# Patient Record
Sex: Female | Born: 1960
Health system: Midwestern US, Community
[De-identification: ages and names within clinical notes are randomized; demographics above are authoritative.]

---

## 2005-07-03 ENCOUNTER — Encounter: Admission: RE | Admit: 2005-07-03 | Discharge: 2005-07-03 | Payer: Self-pay | Admitting: Obstetrics and Gynecology

## 2006-07-18 ENCOUNTER — Encounter: Admission: RE | Admit: 2006-07-18 | Discharge: 2006-07-18 | Payer: Self-pay | Admitting: Obstetrics and Gynecology

## 2007-09-25 ENCOUNTER — Encounter: Admission: RE | Admit: 2007-09-25 | Discharge: 2007-09-25 | Payer: Self-pay | Admitting: Unknown Physician Specialty

## 2007-10-03 ENCOUNTER — Encounter: Admission: RE | Admit: 2007-10-03 | Discharge: 2007-10-03 | Payer: Self-pay | Admitting: Unknown Physician Specialty

## 2008-10-07 ENCOUNTER — Encounter: Admission: RE | Admit: 2008-10-07 | Discharge: 2008-10-07 | Payer: Self-pay | Admitting: Unknown Physician Specialty

## 2010-08-01 ENCOUNTER — Other Ambulatory Visit: Payer: Self-pay | Admitting: *Deleted

## 2010-08-01 DIAGNOSIS — Z1231 Encounter for screening mammogram for malignant neoplasm of breast: Secondary | ICD-10-CM

## 2010-08-03 ENCOUNTER — Ambulatory Visit
Admission: RE | Admit: 2010-08-03 | Discharge: 2010-08-03 | Disposition: A | Discharge status: Home / Self Care | Payer: BC Managed Care – PPO | Source: Ambulatory Visit | Attending: *Deleted | Admitting: *Deleted

## 2010-08-03 DIAGNOSIS — Z1231 Encounter for screening mammogram for malignant neoplasm of breast: Secondary | ICD-10-CM

## 2010-08-09 ENCOUNTER — Other Ambulatory Visit: Payer: Self-pay | Admitting: Obstetrics and Gynecology

## 2010-08-09 DIAGNOSIS — Z1231 Encounter for screening mammogram for malignant neoplasm of breast: Secondary | ICD-10-CM

## 2011-10-16 ENCOUNTER — Other Ambulatory Visit: Payer: Self-pay | Admitting: Obstetrics and Gynecology

## 2011-10-16 DIAGNOSIS — Z1231 Encounter for screening mammogram for malignant neoplasm of breast: Secondary | ICD-10-CM

## 2011-10-30 ENCOUNTER — Ambulatory Visit
Admission: RE | Admit: 2011-10-30 | Discharge: 2011-10-30 | Disposition: A | Discharge status: Home / Self Care | Payer: BC Managed Care – PPO | Source: Ambulatory Visit | Attending: Obstetrics and Gynecology | Admitting: Obstetrics and Gynecology

## 2011-10-30 DIAGNOSIS — Z1231 Encounter for screening mammogram for malignant neoplasm of breast: Secondary | ICD-10-CM

## 2012-03-05 ENCOUNTER — Other Ambulatory Visit: Payer: Self-pay | Admitting: Obstetrics and Gynecology

## 2012-03-05 DIAGNOSIS — Z803 Family history of malignant neoplasm of breast: Secondary | ICD-10-CM

## 2012-03-05 DIAGNOSIS — Z1239 Encounter for other screening for malignant neoplasm of breast: Secondary | ICD-10-CM

## 2012-03-14 ENCOUNTER — Ambulatory Visit
Admission: RE | Admit: 2012-03-14 | Discharge: 2012-03-14 | Disposition: A | Discharge status: Home / Self Care | Payer: BC Managed Care – PPO | Source: Ambulatory Visit | Attending: Obstetrics and Gynecology | Admitting: Obstetrics and Gynecology

## 2012-03-14 DIAGNOSIS — Z1239 Encounter for other screening for malignant neoplasm of breast: Secondary | ICD-10-CM

## 2012-03-14 DIAGNOSIS — Z803 Family history of malignant neoplasm of breast: Secondary | ICD-10-CM

## 2012-03-15 ENCOUNTER — Ambulatory Visit
Admission: RE | Admit: 2012-03-15 | Discharge: 2012-03-15 | Disposition: A | Discharge status: Home / Self Care | Payer: BC Managed Care – PPO | Source: Ambulatory Visit | Attending: Obstetrics and Gynecology | Admitting: Obstetrics and Gynecology

## 2012-03-15 DIAGNOSIS — Z803 Family history of malignant neoplasm of breast: Secondary | ICD-10-CM

## 2012-03-15 DIAGNOSIS — Z1239 Encounter for other screening for malignant neoplasm of breast: Secondary | ICD-10-CM

## 2012-03-15 MED ORDER — GADOBENATE DIMEGLUMINE 529 MG/ML IV SOLN
15.0000 mL | Freq: Once | INTRAVENOUS | Status: AC | PRN
  Administered 2012-03-15: 15 mL via INTRAVENOUS

## 2012-03-19 ENCOUNTER — Other Ambulatory Visit: Payer: Self-pay | Admitting: Obstetrics and Gynecology

## 2012-03-19 DIAGNOSIS — R928 Other abnormal and inconclusive findings on diagnostic imaging of breast: Secondary | ICD-10-CM

## 2012-03-22 ENCOUNTER — Ambulatory Visit
Admission: RE | Admit: 2012-03-22 | Discharge: 2012-03-22 | Disposition: A | Discharge status: Home / Self Care | Payer: BC Managed Care – PPO | Source: Ambulatory Visit | Attending: Obstetrics and Gynecology | Admitting: Obstetrics and Gynecology

## 2012-03-22 DIAGNOSIS — R928 Other abnormal and inconclusive findings on diagnostic imaging of breast: Secondary | ICD-10-CM

## 2012-03-22 MED ORDER — GADOBENATE DIMEGLUMINE 529 MG/ML IV SOLN
15.0000 mL | Freq: Once | INTRAVENOUS | Status: AC | PRN
  Administered 2012-03-22: 15 mL via INTRAVENOUS

## 2017-10-22 ENCOUNTER — Other Ambulatory Visit: Payer: Self-pay | Admitting: Obstetrics and Gynecology

## 2017-10-22 DIAGNOSIS — Z808 Family history of malignant neoplasm of other organs or systems: Secondary | ICD-10-CM

## 2017-10-25 ENCOUNTER — Other Ambulatory Visit: Payer: Self-pay

## 2017-11-16 ENCOUNTER — Ambulatory Visit
Admission: RE | Admit: 2017-11-16 | Discharge: 2017-11-16 | Disposition: A | Discharge status: Home / Self Care | Payer: BC Managed Care – PPO | Source: Ambulatory Visit | Attending: Obstetrics and Gynecology | Admitting: Obstetrics and Gynecology

## 2017-11-16 DIAGNOSIS — Z808 Family history of malignant neoplasm of other organs or systems: Secondary | ICD-10-CM

## 2018-09-09 ENCOUNTER — Other Ambulatory Visit: Payer: Self-pay

## 2018-09-09 ENCOUNTER — Emergency Department (HOSPITAL_COMMUNITY): Payer: Managed Care, Other (non HMO)

## 2018-09-09 ENCOUNTER — Encounter (HOSPITAL_COMMUNITY): Payer: Self-pay

## 2018-09-09 ENCOUNTER — Emergency Department (HOSPITAL_COMMUNITY)
Admission: EM | Admit: 2018-09-09 | Discharge: 2018-09-10 | Disposition: A | Discharge status: Home / Self Care | Payer: Managed Care, Other (non HMO) | Attending: Emergency Medicine | Admitting: Emergency Medicine

## 2018-09-09 DIAGNOSIS — R202 Paresthesia of skin: Secondary | ICD-10-CM | POA: Diagnosis not present

## 2018-09-09 DIAGNOSIS — H1131 Conjunctival hemorrhage, right eye: Secondary | ICD-10-CM | POA: Diagnosis not present

## 2018-09-09 DIAGNOSIS — Z79899 Other long term (current) drug therapy: Secondary | ICD-10-CM | POA: Insufficient documentation

## 2018-09-09 NOTE — ED Triage Notes (Signed)
Pt states she was getting her hair done and the right side of her face and head became numb, she was very comfortable and didn't think she had any pressure on her neck, her right eye is also blood shot which she noticed this am

## 2018-09-09 NOTE — ED Notes (Signed)
Bed: WA03 Expected date:  Expected time:  Means of arrival:  Comments: 

## 2018-09-09 NOTE — ED Notes (Signed)
Bed: WA01 Expected date:  Expected time:  Means of arrival:  Comments: 

## 2018-09-09 NOTE — ED Provider Notes (Signed)
[facility] DEPT Provider Note   CSN: [id number] Arrival date & time: 09/09/18  2046    History   Chief Complaint Chief Complaint  Patient presents with  . facial numbness    HPI Denise Lambert is a 58 y.o. female.   The history is provided by the patient.  She has no significant past history and noted onset at about 7:30 PM numbness in the right side of her scalp in the right side of her face.  This occurred while she was at a beauty parlor having her hair tended to.  Numbness has subsided, but is still present.  She also noted a sub-conjunctiva hemorrhage in her right eye.  She denies any weakness and denies any difficulty with her vision.  She denies headache or neck pain.  She is concerned because her mother had a stroke at age 67.  History reviewed. No pertinent past medical history.  There are no active problems to display for this patient.   History reviewed. No pertinent surgical history.   OB History   No obstetric history on file.      Home Medications    Prior to Admission medications   Not on File    Family History History reviewed. No pertinent family history.  Social History Social History   Tobacco Use  . Smoking status: Never Smoker  . Smokeless tobacco: Never Used  Substance Use Topics  . Alcohol use: Never    Frequency: Never  . Drug use: Never     Allergies   Ciprofloxacin; Penicillins; and Vioxx [rofecoxib]   Review of Systems Review of Systems  All other systems reviewed and are negative.    Physical Exam Updated Vital Signs BP (!) 128/58 (BP Location: Left Arm)   Pulse (!) 52   Temp 97.8 F (36.6 C) (Oral)   Resp 19   Ht 5\' 4"  (1.626 m)   Wt 70.3 kg   SpO2 96%   BMI 26.61 kg/m   Physical Exam Vitals signs and nursing note reviewed.    58 year old female, resting comfortably and in no acute distress. Vital signs are significant for slow heart rate. Oxygen saturation is 96%, which is  normal. Head is normocephalic and atraumatic. PERRLA, EOMI. Oropharynx is clear.  Subconjunctival hemorrhage present on the right eye medially. Neck is nontender and supple without adenopathy or JVD. Back is nontender and there is no CVA tenderness. Lungs are clear without rales, wheezes, or rhonchi. Chest is nontender. Heart has regular rate and rhythm without murmur. Abdomen is soft, flat, nontender without masses or hepatosplenomegaly and peristalsis is normoactive. Extremities have no cyanosis or edema, full range of motion is present. Skin is warm and dry without rash. Neurologic: Mental status is normal.  Visual fields intact to confrontation.  There is slight decreased sensation on the right side of the face and the first and second divisions of the trigeminal nerve as well as on the scalp.  Remainder of cranial nerves are intact.  There is also slight decreased sensation in the right shoulder and arm compared with the left.  There is no extinction on double simultaneous stimulation.  Sensation in the legs is normal.  Motor strength is 5/5 in all muscle groups.  Station and gait are normal.  Speech is normal.  ED Treatments / Results  Labs (all labs ordered are listed, but only abnormal results are displayed) Labs Reviewed  PROTIME-INR - Abnormal; Notable for the following components:  Result Value   Prothrombin Time 11.3 (*)    All other components within normal limits  APTT - Abnormal; Notable for the following components:   aPTT 21 (*)    All other components within normal limits  CBC - Abnormal; Notable for the following components:   Platelets 91 (*)    All other components within normal limits  COMPREHENSIVE METABOLIC PANEL - Abnormal; Notable for the following components:   Creatinine, Ser 1.03 (*)    All other components within normal limits  URINALYSIS, ROUTINE W REFLEX MICROSCOPIC - Abnormal; Notable for the following components:   Color, Urine STRAW (*)    Hgb  urine dipstick SMALL (*)    Leukocytes,Ua MODERATE (*)    Bacteria, UA RARE (*)    All other components within normal limits  ETHANOL  DIFFERENTIAL  RAPID URINE DRUG SCREEN, HOSP PERFORMED  I-STAT BETA HCG BLOOD, ED ([facility], [facility], [facility] ONLY)    EKG EKG Interpretation  Date/Time:  Tuesday September 10 2018 00:00:47 EDT Ventricular Rate:  55 PR Interval:    QRS Duration: 100 QT Interval:  473 QTC Calculation: 453 R Axis:   76 Text Interpretation:  Sinus rhythm Low voltage, precordial leads Anteroseptal infarct, old No old tracing to compare Confirmed by Dione BoozeGlick, Salam Micucci ([id number]) on 09/10/2018 1:27:27 AM   Radiology Ct Head Wo Contrast  Result Date: 09/10/2018 CLINICAL DATA:  Right-side of head and face numbness status post getting hair done. EXAM: CT HEAD WITHOUT CONTRAST TECHNIQUE: Contiguous axial images were obtained from the base of the skull through the vertex without intravenous contrast. COMPARISON:  None. FINDINGS: Brain: No evidence of acute infarction, hemorrhage, hydrocephalus, extra-axial collection or mass lesion/mass effect. There is some mild chronic microvascular ischemic changes noted, especially on the left. Vascular: No hyperdense vessel or unexpected calcification. Skull: Normal. Negative for fracture or focal lesion. Sinuses/Orbits: No acute finding. Other: None. IMPRESSION: No acute intracranial abnormality detected. Electronically Signed   By: Katherine Mantlehristopher  Green M.D.   On: 09/10/2018 00:31    Procedures Procedures  Medications Ordered in ED Medications - No data to display   Initial Impression / Assessment and Plan / ED Course  I have reviewed the triage vital signs and the nursing notes.  Pertinent labs & imaging results that were available during my care of the patient were reviewed by me and considered in my medical decision making (see chart for details).  Right-sided numbness worrisome for small stroke.  She is outside of the time window for thrombolytic therapy, and NIH  stroke scale is too low to be considered for interventional radiology approach.  Therefore, code stroke is not activated.  Stroke work-up is initiated.  Old records are reviewed, and she has no relevant past visits.  Some conjunctival hemorrhage on the right is apparently unrelated.  The work-up is unremarkable.  CT of head is normal and labs are all normal.  [facility] to get MRI to rule out stroke.  I have discussed case with Dr. Manus Gunningancour who agrees to accept the patient  Final Clinical Impressions(s) / ED Diagnoses   Final diagnoses:  Paresthesia  Subconjunctival hemorrhage, right    ED Discharge Orders    None       Dione BoozeGlick, Askia Hazelip, MD 09/10/18 0127

## 2018-09-10 ENCOUNTER — Emergency Department (HOSPITAL_COMMUNITY): Payer: Managed Care, Other (non HMO)

## 2018-09-10 LAB — I-STAT BETA HCG BLOOD, ED (MC, WL, AP ONLY): I-stat hCG, quantitative: <5 m[IU]/mL (ref <5)

## 2018-09-10 LAB — DIFFERENTIAL
Abs Immature Granulocytes: 0.01 10*3/uL (ref 0.00–0.07)
Basophils Absolute: 0.1 10*3/uL (ref 0.0–0.1)
Basophils Relative: 1 %
Eosinophils Absolute: 0.1 10*3/uL (ref 0.0–0.5)
Eosinophils Relative: 2 %
Immature Granulocytes: 0 %
Lymphocytes Relative: 43 %
Lymphs Abs: 2.4 10*3/uL (ref 0.7–4.0)
Monocytes Absolute: 0.5 10*3/uL (ref 0.1–1.0)
Monocytes Relative: 9 %
Neutro Abs: 2.5 10*3/uL (ref 1.7–7.7)
Neutrophils Relative %: 45 %

## 2018-09-10 LAB — COMPREHENSIVE METABOLIC PANEL
ALT: 38 U/L (ref 0–44)
AST: 38 U/L (ref 15–41)
Albumin: 4.7 g/dL (ref 3.5–5.0)
Alkaline Phosphatase: 81 U/L (ref 38–126)
Anion gap: 8 (ref 5–15)
BUN: 20 mg/dL (ref 6–20)
CO2: 27 mmol/L (ref 22–32)
Calcium: 9.3 mg/dL (ref 8.9–10.3)
Chloride: 104 mmol/L (ref 98–111)
Creatinine, Ser: 1.03 mg/dL — ABNORMAL HIGH (ref 0.44–1.00)
GFR calc Af Amer: >60 mL/min (ref >60)
GFR calc non Af Amer: >60 mL/min (ref >60)
Glucose, Bld: 89 mg/dL (ref 70–99)
Potassium: 3.8 mmol/L (ref 3.5–5.1)
Sodium: 139 mmol/L (ref 135–145)
Total Bilirubin: 0.6 mg/dL (ref 0.3–1.2)
Total Protein: 7.5 g/dL (ref 6.5–8.1)

## 2018-09-10 LAB — URINALYSIS, ROUTINE W REFLEX MICROSCOPIC
Bilirubin Urine: NEGATIVE
Glucose, UA: NEGATIVE mg/dL
Ketones, ur: NEGATIVE mg/dL
Nitrite: NEGATIVE
Protein, ur: NEGATIVE mg/dL
Specific Gravity, Urine: 1.006 (ref 1.005–1.030)
pH: 6 (ref 5.0–8.0)

## 2018-09-10 LAB — RAPID URINE DRUG SCREEN, HOSP PERFORMED
Amphetamines: NOT DETECTED
Barbiturates: NOT DETECTED
Benzodiazepines: NOT DETECTED
Cocaine: NOT DETECTED
Opiates: NOT DETECTED
Tetrahydrocannabinol: NOT DETECTED

## 2018-09-10 LAB — CBC
HCT: 43.3 % (ref 36.0–46.0)
Hemoglobin: 14.3 g/dL (ref 12.0–15.0)
MCH: 29.5 pg (ref 26.0–34.0)
MCHC: 33 g/dL (ref 30.0–36.0)
MCV: 89.3 fL (ref 80.0–100.0)
Platelets: 91 10*3/uL — ABNORMAL LOW (ref 150–400)
RBC: 4.85 MIL/uL (ref 3.87–5.11)
RDW: 12.5 % (ref 11.5–15.5)
WBC: 5.5 10*3/uL (ref 4.0–10.5)
nRBC: 0 % (ref 0.0–0.2)

## 2018-09-10 LAB — PROTIME-INR
INR: 0.8 (ref 0.8–1.2)
Prothrombin Time: 11.3 seconds — ABNORMAL LOW (ref 11.4–15.2)

## 2018-09-10 LAB — APTT: aPTT: 21 seconds — ABNORMAL LOW (ref 24–36)

## 2018-09-10 LAB — ETHANOL: Alcohol, Ethyl (B): <10 mg/dL (ref <10)

## 2018-09-10 NOTE — ED Notes (Signed)
Patient transported to MRI from lobby

## 2018-09-10 NOTE — ED Notes (Signed)
MRI has one on the exam table then patient will be next.

## 2018-09-10 NOTE — ED Provider Notes (Signed)
Patient was sent from [facility] long for an MRI of her brain.  She has been having numbness throughout her face. No facial droop.  Speech is fluent.  No arm drift.  MRI negative.  She does have right subconjunctival hemorrhage of unclear etiology.  No rash on her face to suggest early zoster. Patient is overall well-appearing.  She feels comfortable discharge home.  She will follow-up with her PCP this week for further evaluation.   Ripley Fraise, MD 09/10/18 [phone]

## 2018-09-10 NOTE — ED Notes (Signed)
Discharge instructions and follow up care discussed with pt. Pt verbalized understanding and has no questions at this time. Teach back method used

## 2018-09-10 NOTE — ED Notes (Signed)
Patient transported to CT 

## 2018-11-13 ENCOUNTER — Other Ambulatory Visit: Payer: Self-pay | Admitting: Obstetrics and Gynecology

## 2018-11-13 DIAGNOSIS — E041 Nontoxic single thyroid nodule: Secondary | ICD-10-CM

## 2018-11-18 ENCOUNTER — Other Ambulatory Visit: Payer: 59

## 2018-11-21 ENCOUNTER — Ambulatory Visit
Admission: RE | Admit: 2018-11-21 | Discharge: 2018-11-21 | Disposition: A | Discharge status: Home / Self Care | Payer: Managed Care, Other (non HMO) | Source: Ambulatory Visit | Attending: Obstetrics and Gynecology | Admitting: Obstetrics and Gynecology

## 2018-11-21 DIAGNOSIS — E041 Nontoxic single thyroid nodule: Secondary | ICD-10-CM

## 2020-07-16 LAB — HEMOGLOBIN A1C: Hemoglobin A1C, External: 5.4 % (ref 4.3–5.6)

## 2020-12-19 IMAGING — US US THYROID
1 series · 13 of 25 positions shown · non-contrast
Comparison: 11/16/2017

CLINICAL DATA: Nodule.

EXAM:
THYROID ULTRASOUND
TECHNIQUE: Ultrasound examination of the thyroid gland and adjacent soft
tissues was performed.

[Series 1: us thyroid · 0.05mm/px · 13 of 47 slices shown]
[im 1/47]
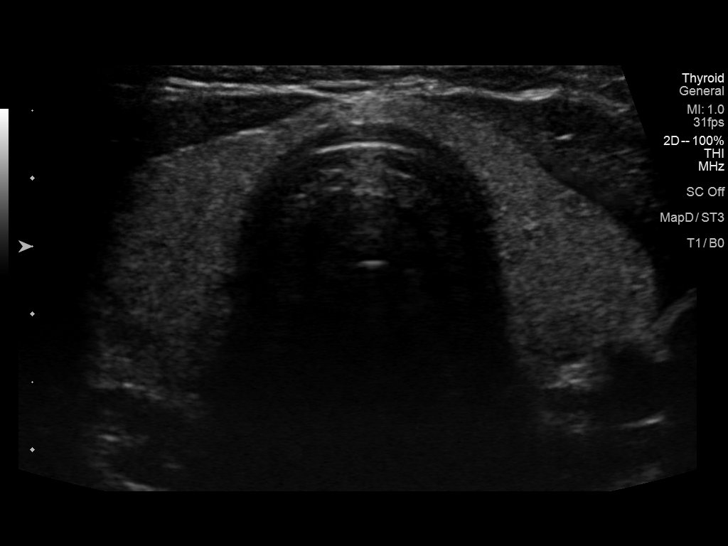
[im 4/47]
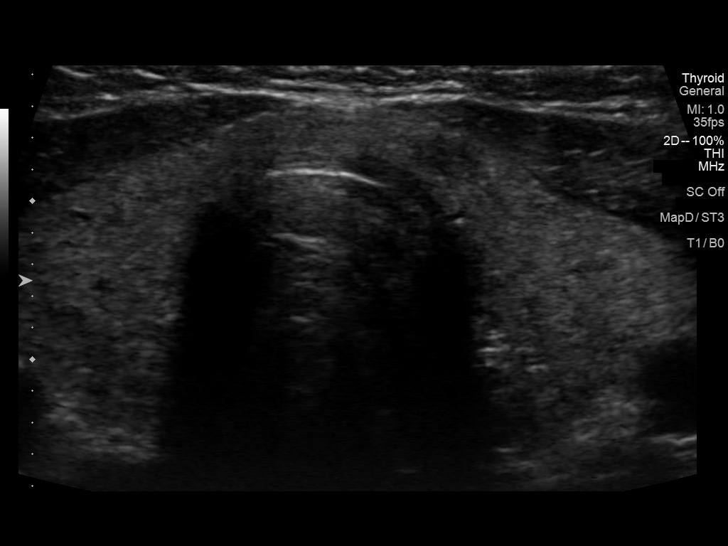
[im 8/47]
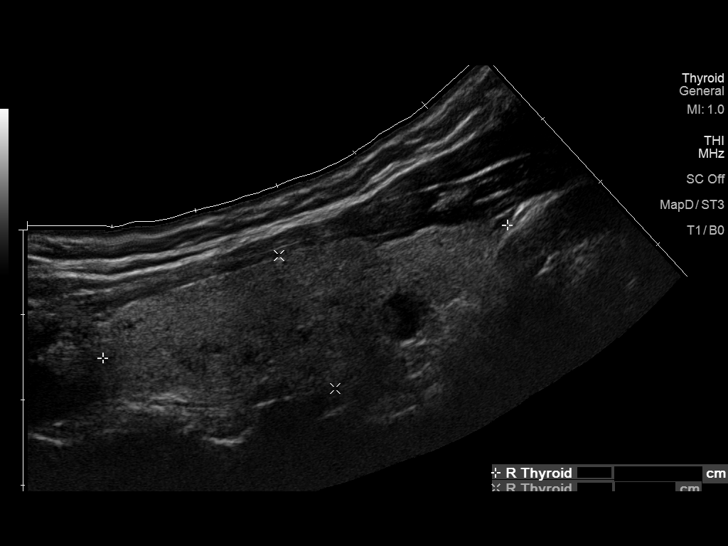
[im 12/47]
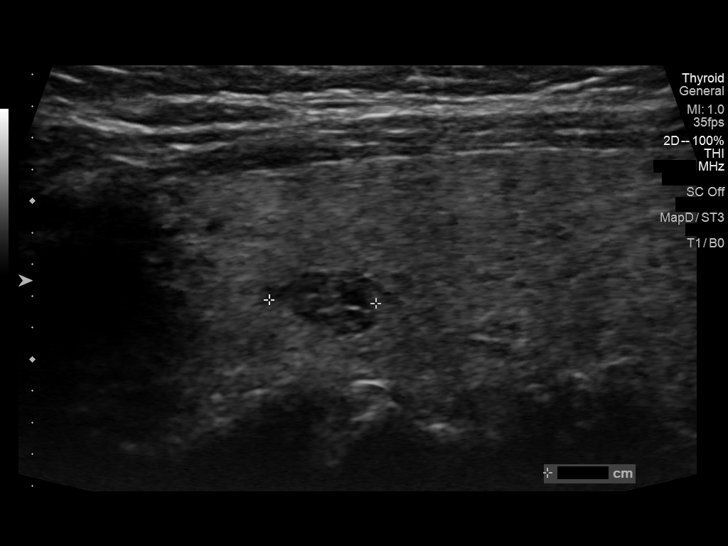
[im 16/47]
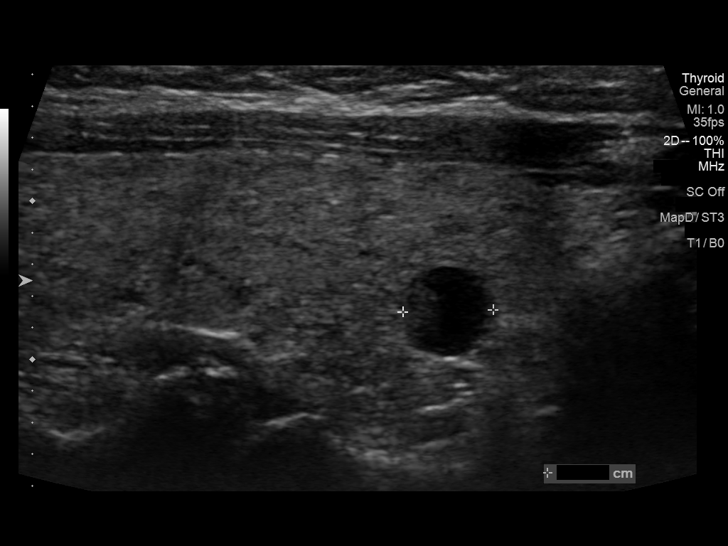
[im 20/47]
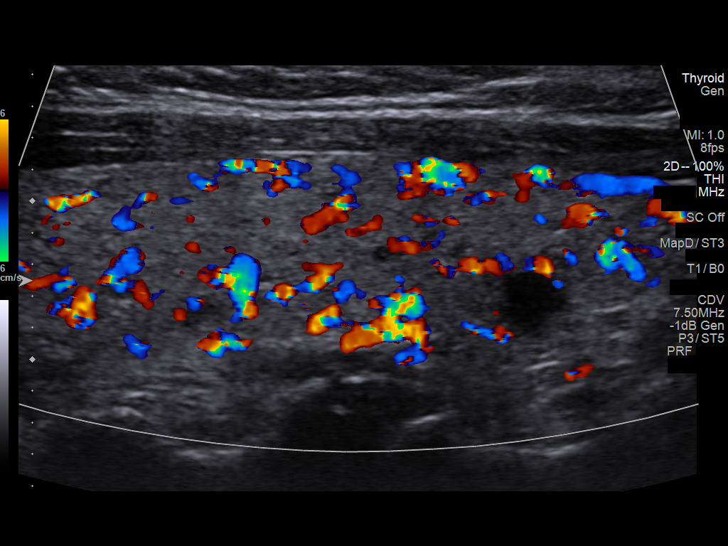
[im 24/47]
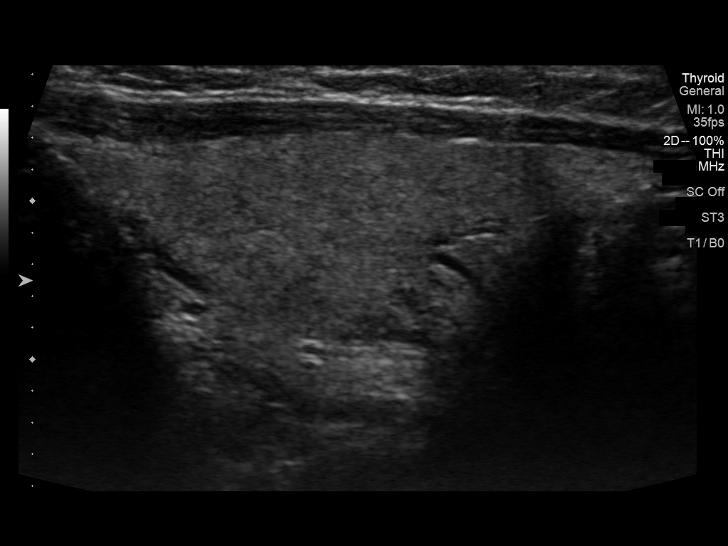
[im 27/47]
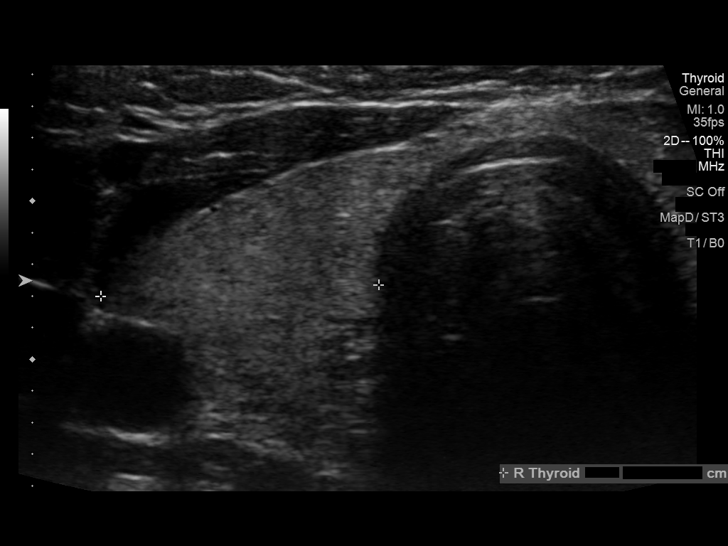
[im 31/47]
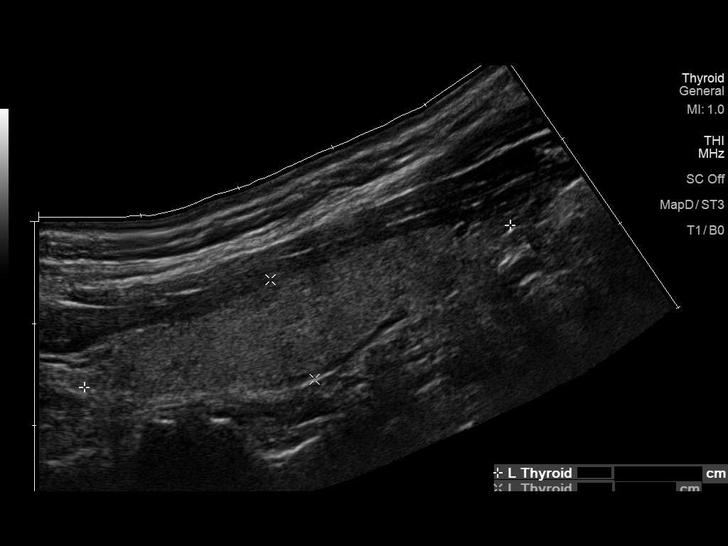
[im 35/47]
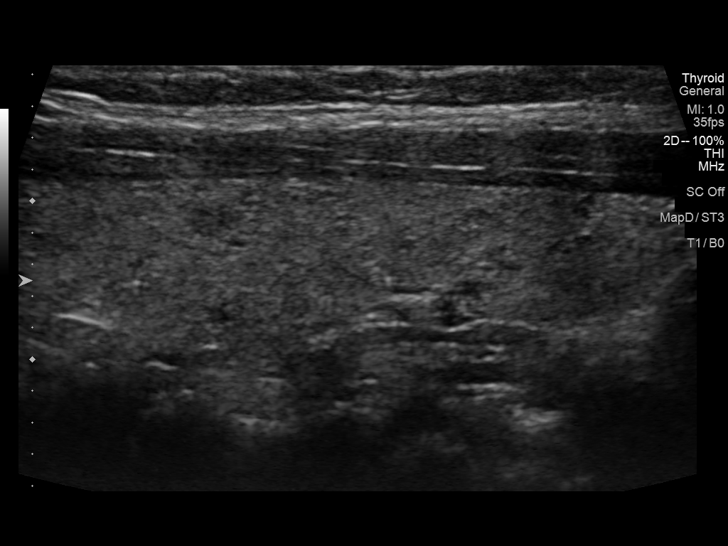
[im 39/47]
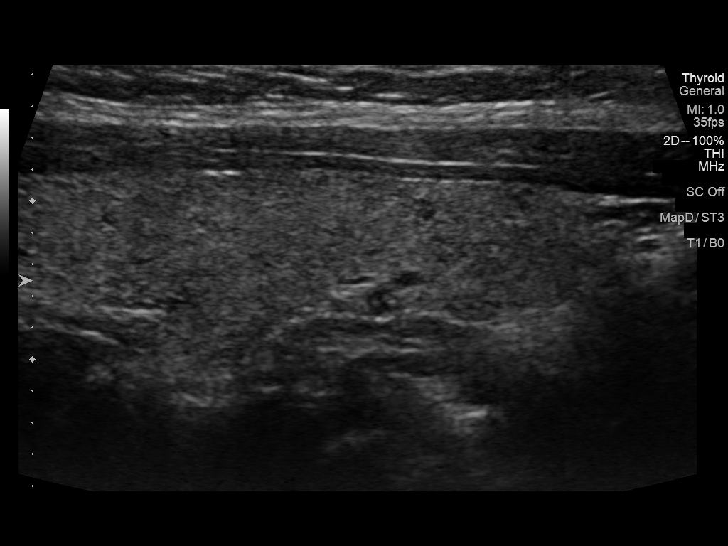
[im 43/47]
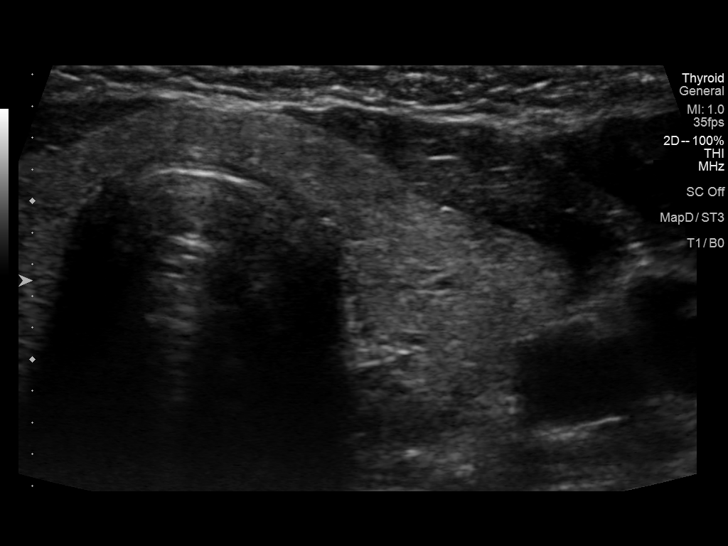
[im 47/47]
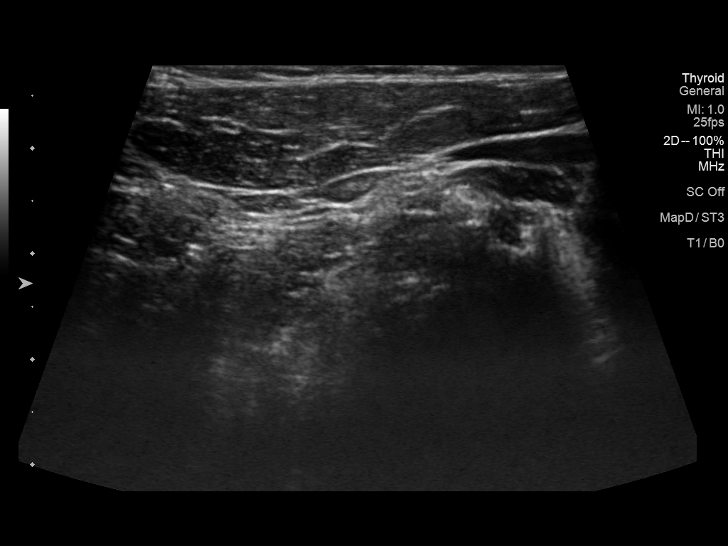

[13 of 25 positions shown; findings below may reference images not displayed]

FINDINGS: Parenchymal Echotexture: Mildly heterogenous

Isthmus: 0.3 cm thickness, stable

Right lobe: 5 x 1.7 x 1.8 cm, previously 5 x 1.3 x

Left lobe: 4.5 x 1.1 x 1.5 cm, previously 4.6 x 1 x

_________________________________________________________

Estimated total number of nodules >/= 1 cm: 0

Number of spongiform nodules >/=  2 cm not described below (TR1): 0

Number of mixed cystic and solid nodules >/= 1.5 cm not described
below (TR2): 0

_________________________________________________________

0.7 cm hypoechoic nodule without calcifications, superior right;
This nodule does NOT meet TI-RADS criteria for biopsy or dedicated
follow-up.

0.6 cm benign colloid cyst, inferior right

Scattered additional hypoechoic/cystic regions in both lobes, none
greater than 0.5 cm.
IMPRESSION: 1. Thyroid size upper limits normal, with scattered subcentimeter
nodules and cysts. None meets criteria for biopsy or dedicated
imaging follow-up.

The above is in keeping with the ACR TI-RADS recommendations - [HOSPITAL] 4917;[DATE].

## 2021-03-07 ENCOUNTER — Ambulatory Visit: Admit: 2021-03-07 | Discharge: 2021-03-07 | Payer: BLUE CROSS/BLUE SHIELD | Primary: Diagnostic Radiology

## 2021-03-07 ENCOUNTER — Encounter
Admit: 2021-03-07 | Discharge: 2021-03-07 | Payer: BLUE CROSS/BLUE SHIELD | Attending: Orthopaedic Surgery | Primary: Diagnostic Radiology

## 2021-03-07 DIAGNOSIS — M25552 Pain in left hip: Secondary | ICD-10-CM

## 2021-03-07 MED ORDER — LIDOCAINE HCL 1 % IJ SOLN
1 % | Freq: Once | INTRAMUSCULAR | Status: AC
Start: 2021-03-07 — End: 2021-03-07
  Administered 2021-03-07: 16:00:00 1 mL via INTRA_ARTICULAR

## 2021-03-07 MED ORDER — METHYLPREDNISOLONE ACETATE 40 MG/ML IJ SUSP
40 MG/ML | Freq: Once | INTRAMUSCULAR | Status: AC
Start: 2021-03-07 — End: 2021-03-07
  Administered 2021-03-07: 16:00:00 40 mg via INTRA_ARTICULAR

## 2021-03-07 MED ORDER — BUPIVACAINE HCL 0.5 % IJ SOLN
0.5 % | Freq: Once | INTRAMUSCULAR | Status: AC
Start: 2021-03-07 — End: 2021-03-07
  Administered 2021-03-07: 16:00:00 5 mL via INTRA_ARTICULAR
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
## 2021-03-08 ENCOUNTER — Encounter
Admit: 2021-03-08 | Payer: BLUE CROSS/BLUE SHIELD | Attending: Student in an Organized Health Care Education/Training Program | Primary: Diagnostic Radiology

## 2021-03-08 DIAGNOSIS — M62838 Other muscle spasm: Secondary | ICD-10-CM

## 2021-03-08 MED ORDER — TIZANIDINE HCL 2 MG PO TABS
2 MG | ORAL_TABLET | Freq: Three times a day (TID) | ORAL | 3 refills | Status: AC | PRN
Start: 2021-03-08 — End: ?
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
## 2021-04-18 ENCOUNTER — Encounter
Admit: 2021-04-18 | Discharge: 2021-04-18 | Payer: BLUE CROSS/BLUE SHIELD | Attending: Orthopaedic Surgery | Primary: Diagnostic Radiology

## 2021-04-18 DIAGNOSIS — M25552 Pain in left hip: Secondary | ICD-10-CM

## 2021-04-18 NOTE — Progress Notes (Signed)
[facility]  [facility]  [address] [place]  [place] [facility] [address]  Dept: [phone]     04/18/2021    Pam Price (DOB: 12-23-1960) is a 61 y.o. female, here for evaluation of the following chief complaint(s): Hip Pain (Left hip follow up - last injection helped but patient is still having pain)      SUBJECTIVE/OBJECTIVE:    History of Present Illness: Patient is seen today for follow-up of her left hip pain.  She is doing better.  She notes the injection did help a lot of her pain.  However she notes she still has some tenderness primarily in the muscle region distal to her hip itself.  She notes that this does bother her going up stairs as well as when she sleeps on her right side.  She also notes when she is watching TV with her legs elevated and knee extended this will cause her some discomfort.  She has been workng on stretching Pilates and barre exercises as well as yoga.  She denies any back pain or radicular symptoms.  Review of Systems:Non contributory other than as HPI    Current Outpatient Medications   Medication Sig Dispense Refill    tiZANidine (ZANAFLEX) 2 MG tablet Take 1 tablet by mouth 3 times daily as needed (muscle spasm) 30 tablet 3     No current facility-administered medications for this visit.        Allergies   Allergen Reactions    Ciprofloxacin Hives    Codeine Hives and Rash    Penicillins Hives and Rash     Did it involve swelling of the face/tongue/throat, SOB, or low BP? No  Did it involve sudden or severe rash/hives, skin peeling, or any reaction on the inside of your mouth or nose? Yes  Did you need to seek medical attention at a hospital or doctor's office? Yes  When did it last happen? 1984  If all above answers are ???NO???, may proceed with cephalosporin use.  Did it involve swelling of the face/tongue/throat, SOB, or low BP? No  Did it involve sudden or severe rash/hives, skin peeling, or any reaction on the inside of your mouth or nose?  Yes  Did you need to seek medical attention at a hospital or doctor's office? Yes  When did it last happen? 1984  If all above answers are ???NO???, may proceed with cephalosporin use.      Rofecoxib Hives and Rash        No past medical history on file.     No past surgical history on file.     No family history on file.     Social History     Socioeconomic History    Marital status: Unknown     Spouse name: Not on file    Number of children: Not on file    Years of education: Not on file    Highest education level: Not on file   Occupational History    Not on file   Tobacco Use    Smoking status: Never     Passive exposure: Never    Smokeless tobacco: Never   Substance and Sexual Activity    Alcohol use: Never    Drug use: Never    Sexual activity: Not on file   Other Topics Concern    Not on file   Social History Narrative    Not on file     Social Determinants of Health  Financial Resource Strain: Not on file   Food Insecurity: Not on file   Transportation Needs: Not on file   Physical Activity: Not on file   Stress: Not on file   Social Connections: Not on file   Intimate Partner Violence: Not on file   Housing Stability: Not on file        Vitals:    04/18/21 0853   Weight: 150 lb (68 kg)   Height: 5\' 4"  (1.626 m)            Ancillary Data Reviewed:      Physical Examination:  HEENT: Normocephalic atraumatic     Neck: Normal flexibility for age, no JVD     Ocular: Sclera clear     Chest: No audible wheezes     Cardiovascular: Regular rate, no peripheral edema    Lumbar spine: Normal flexibility for age    Upper extremity: Good range of motion of the shoulders elbows wrist     Lower extremity: Good range of motion for hips, knees, and ankles, no tenderness over the SI joint is noted.  No tenderness over the greater trochanteric region.  She does have some tenderness over the IT band distal.  No pain with internal or external rotation of stress testing of the hips noted.     Neurologic: No focal defects noted      Dermatologic: No lesions on exposed areas      Imaging  XR HIP LEFT (2-3 VIEWS) (CLINIC PERFORMED)  AP pelvis and lateral of the left hip is taken today.  This does not show   any significant arthritic changes of the hip.  Some SI joint changes   present.  No other significant bony abnormalities noted.               ASSESSMENT/PLAN:    1. Left hip pain  2. Iliotibial band syndrome of left side  We have reviewed her examination as well as her previous studies.  She is doing better than before but still has some discomfort.  We have suggested formal physical therapy but she does not want to do this.  We discussed self-directed physical therapy which she states she will do.  We have also discussed using a hand-held massage gun.  She is to take Tylenol Advil or Aleve as needed.  She is to return to see in 6 weeks if not doing better.    No orders of the defined types were placed in this encounter.       Return in about 6 weeks (around 05/30/2021).        An electronic signature was used to authenticate this note.    06/01/2021, MD
